# Patient Record
Sex: Female | Born: 1960 | Race: Black or African American | Hispanic: No | Marital: Single | Smoking: Current some day smoker
Health system: Southern US, Community
[De-identification: ages and names within clinical notes are randomized; demographics above are authoritative.]

## PROBLEM LIST (undated history)

## (undated) DIAGNOSIS — E785 Hyperlipidemia, unspecified: Secondary | ICD-10-CM

## (undated) DIAGNOSIS — M810 Age-related osteoporosis without current pathological fracture: Secondary | ICD-10-CM

## (undated) DIAGNOSIS — M1990 Unspecified osteoarthritis, unspecified site: Secondary | ICD-10-CM

## (undated) DIAGNOSIS — J449 Chronic obstructive pulmonary disease, unspecified: Secondary | ICD-10-CM

## (undated) DIAGNOSIS — I10 Essential (primary) hypertension: Secondary | ICD-10-CM

## (undated) DIAGNOSIS — F319 Bipolar disorder, unspecified: Secondary | ICD-10-CM

## (undated) DIAGNOSIS — I209 Angina pectoris, unspecified: Secondary | ICD-10-CM

## (undated) DIAGNOSIS — E039 Hypothyroidism, unspecified: Secondary | ICD-10-CM

## (undated) DIAGNOSIS — G5600 Carpal tunnel syndrome, unspecified upper limb: Secondary | ICD-10-CM

## (undated) HISTORY — PX: CARDIAC CATHETERIZATION: SHX172

## (undated) HISTORY — PX: OTHER SURGICAL HISTORY: SHX169

## (undated) HISTORY — PX: ABDOMINAL HYSTERECTOMY: SHX81

---

## 2008-10-24 ENCOUNTER — Emergency Department (HOSPITAL_COMMUNITY): Admission: EM | Admit: 2008-10-24 | Discharge: 2008-10-24 | Payer: Self-pay | Admitting: Emergency Medicine

## 2008-10-25 ENCOUNTER — Ambulatory Visit (HOSPITAL_COMMUNITY): Admission: RE | Admit: 2008-10-25 | Discharge: 2008-10-25 | Payer: Self-pay | Admitting: Emergency Medicine

## 2009-03-03 ENCOUNTER — Emergency Department (HOSPITAL_COMMUNITY): Admission: EM | Admit: 2009-03-03 | Discharge: 2009-03-03 | Payer: Self-pay | Admitting: Emergency Medicine

## 2009-03-04 ENCOUNTER — Emergency Department (HOSPITAL_COMMUNITY): Admission: EM | Admit: 2009-03-04 | Discharge: 2009-03-04 | Payer: Self-pay | Admitting: Emergency Medicine

## 2009-04-26 IMAGING — US US EXTREM LOW VENOUS*L*
1 series · 14 of 24 positions shown · non-contrast
Comparison: None available

CLINICAL DATA: Calf pain and swelling

LEFT LOWER EXTREMITY VENOUS DOPPLER ULTRASOUND
TECHNIQUE: Gray-scale sonography with compression, as well as color
and duplex ultrasound, were performed to evaluate the deep venous
system from the level of the common femoral vein through the
popliteal and proximal calf veins.

[Series 1: us extrem low venous*left* · 14 of 29 slices shown]
[im 1/29]
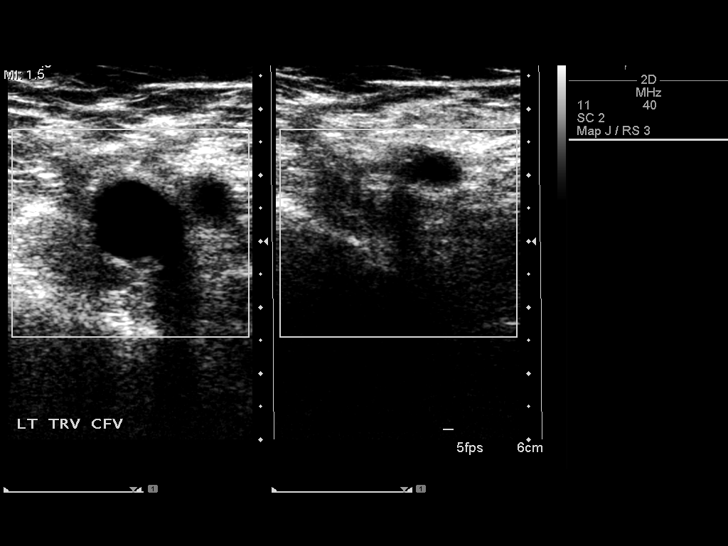
[im 3/29]
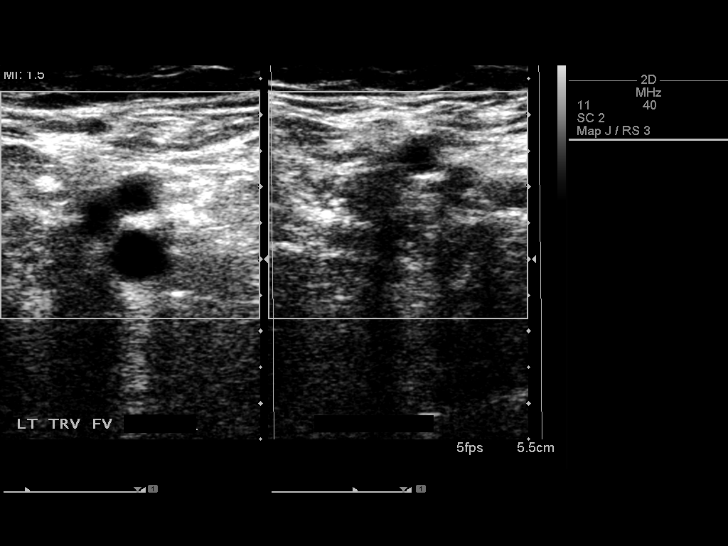
[im 5/29]
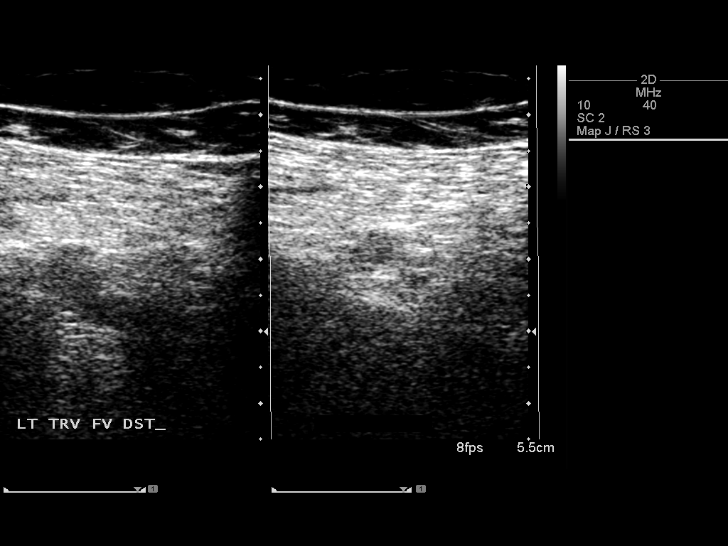
[im 8/29]
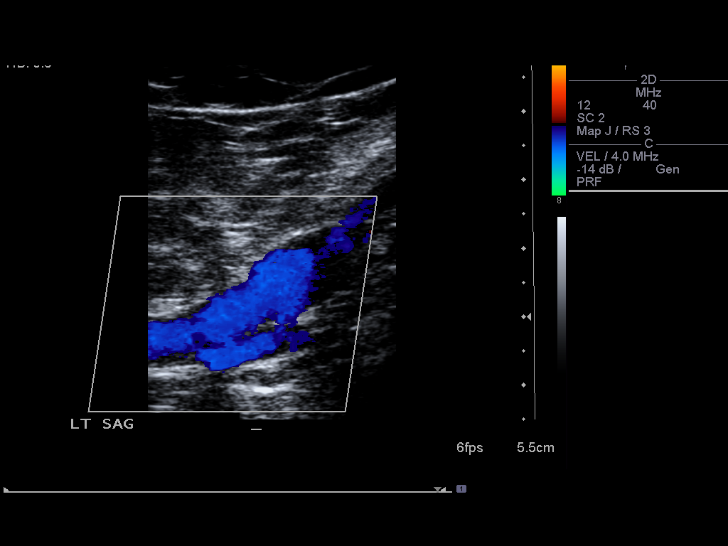
[im 9/29]
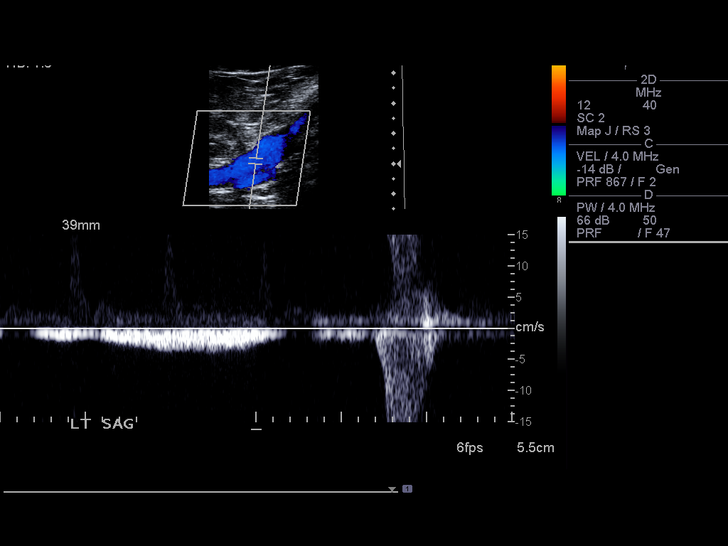
[im 11/29]
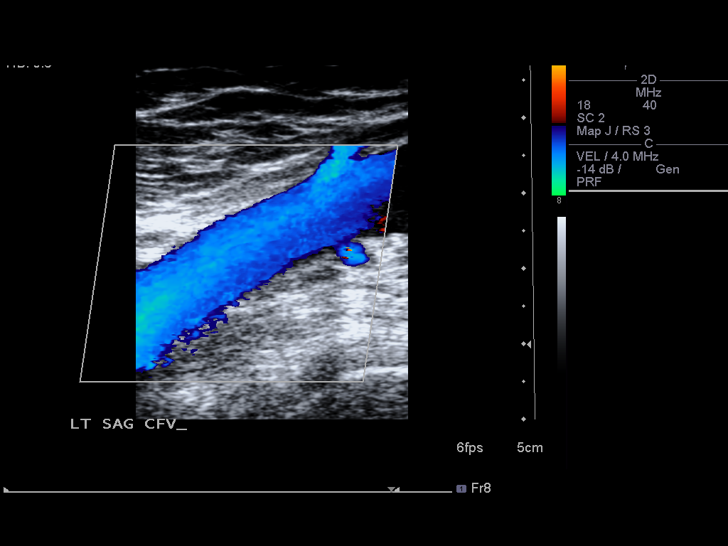
[im 14/29]
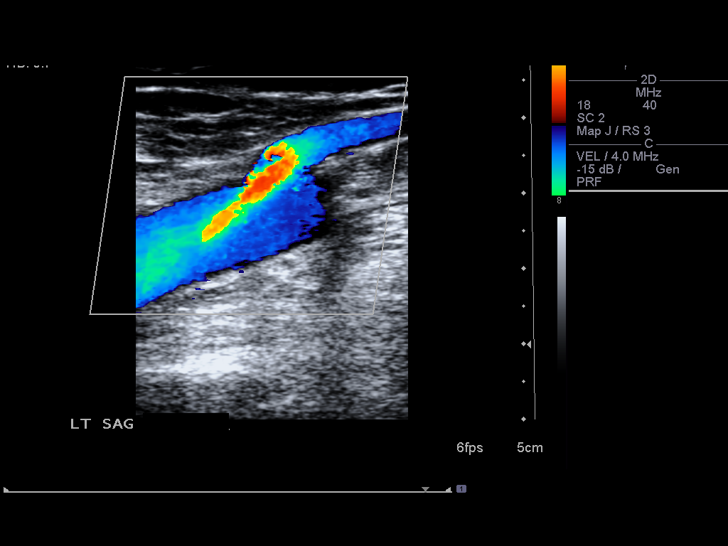
[im 15/29]
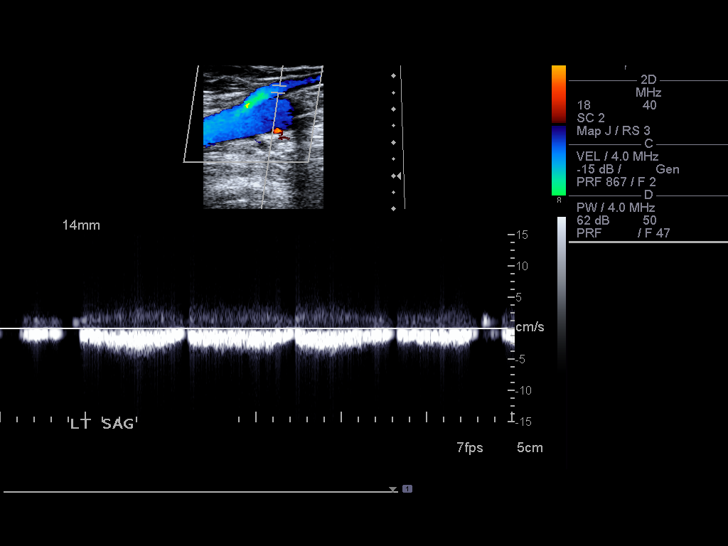
[im 18/29]
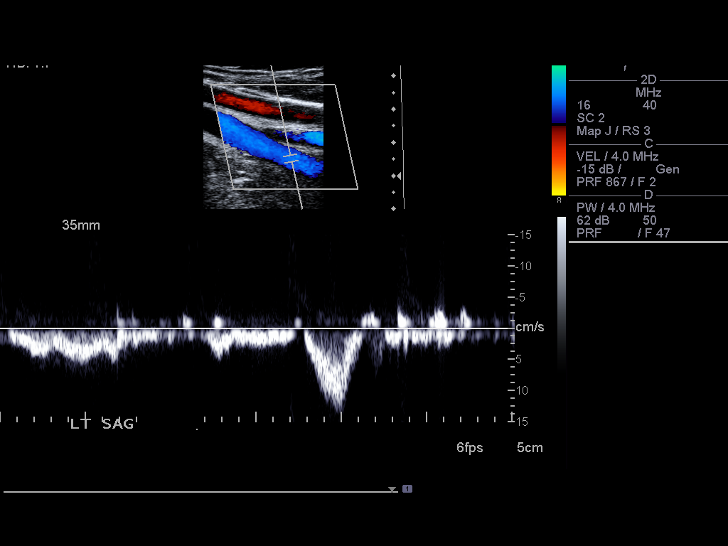
[im 20/29]
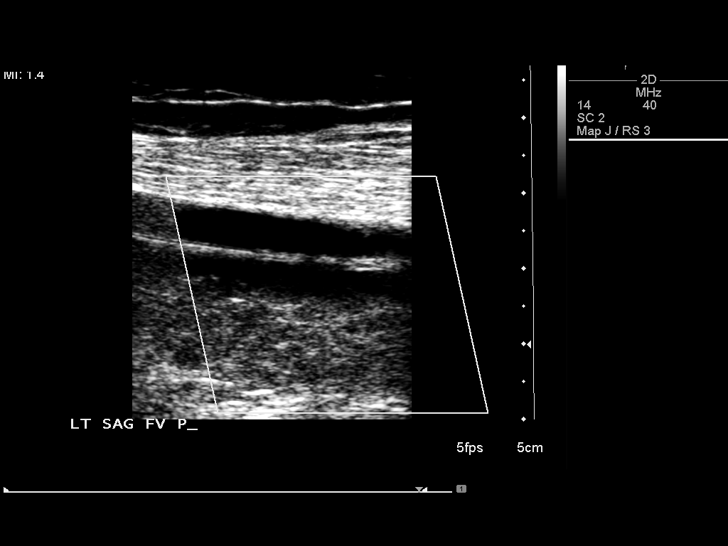
[im 22/29]
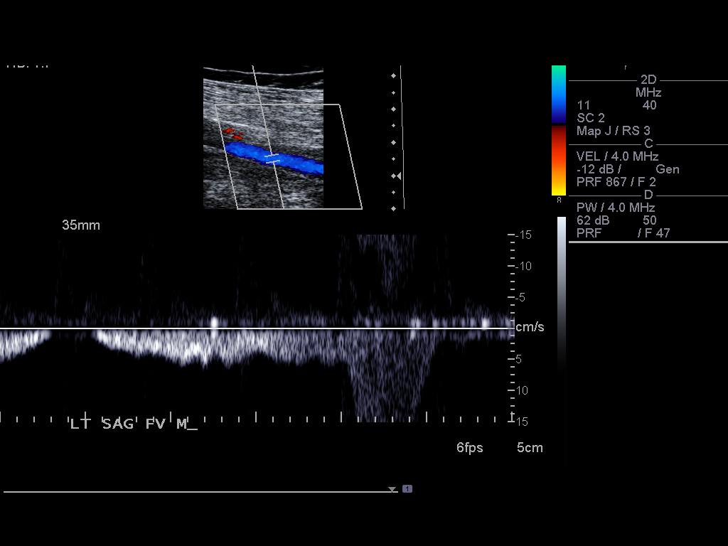
[im 24/29]
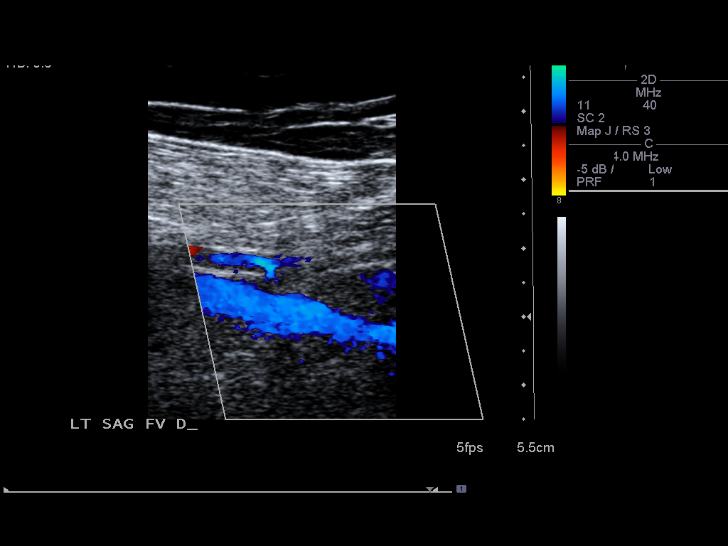
[im 26/29]
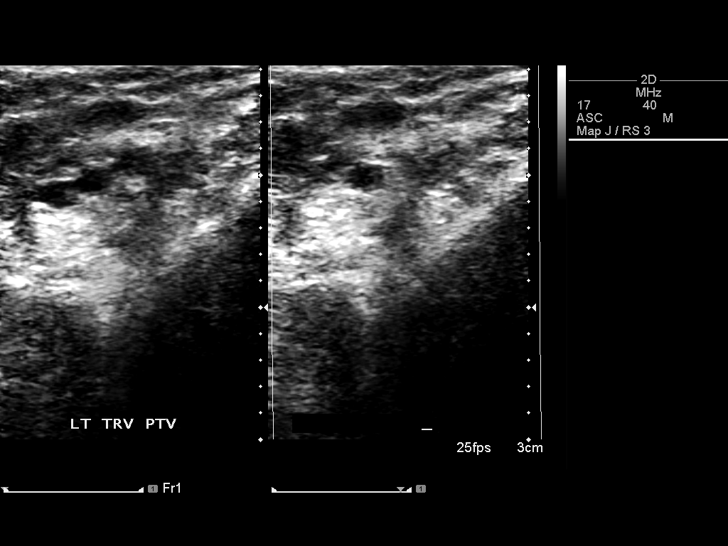
[im 29/29]
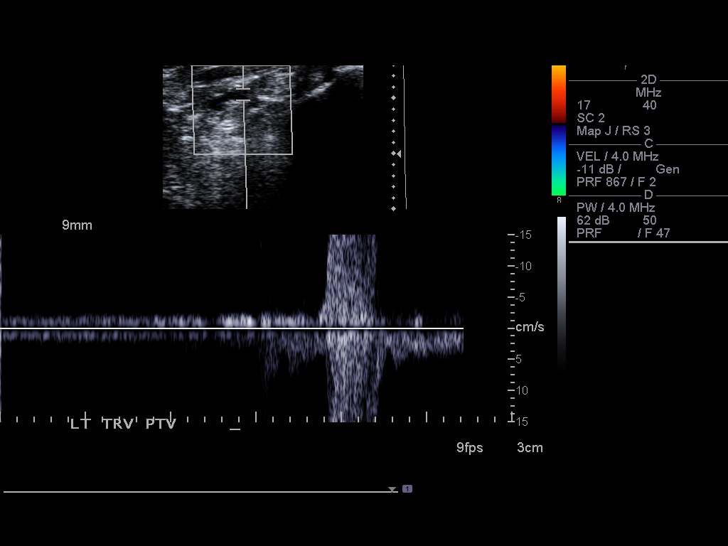

[14 of 24 positions shown; findings below may reference images not displayed]

FINDINGS: Normal compressibility of  the common femoral,
superficial femoral, and popliteal veins, as well as the proximal
calf veins.  No filling defects to suggest DVT on grayscale or
color Doppler imaging.  Doppler waveforms show normal direction of
venous flow, normal respiratory phasicity and response to
augmentation.
IMPRESSION: No evidence of  lower extremity deep vein thrombosis.

## 2012-11-18 ENCOUNTER — Encounter (HOSPITAL_COMMUNITY): Payer: Self-pay | Admitting: *Deleted

## 2012-11-18 ENCOUNTER — Emergency Department (HOSPITAL_COMMUNITY)
Admission: EM | Admit: 2012-11-18 | Discharge: 2012-11-18 | Disposition: A | Discharge status: Home / Self Care | Payer: Self-pay | Attending: Emergency Medicine | Admitting: Emergency Medicine

## 2012-11-18 DIAGNOSIS — E785 Hyperlipidemia, unspecified: Secondary | ICD-10-CM | POA: Insufficient documentation

## 2012-11-18 DIAGNOSIS — J4489 Other specified chronic obstructive pulmonary disease: Secondary | ICD-10-CM | POA: Insufficient documentation

## 2012-11-18 DIAGNOSIS — E039 Hypothyroidism, unspecified: Secondary | ICD-10-CM | POA: Insufficient documentation

## 2012-11-18 DIAGNOSIS — G8929 Other chronic pain: Secondary | ICD-10-CM

## 2012-11-18 DIAGNOSIS — J449 Chronic obstructive pulmonary disease, unspecified: Secondary | ICD-10-CM | POA: Insufficient documentation

## 2012-11-18 DIAGNOSIS — F319 Bipolar disorder, unspecified: Secondary | ICD-10-CM | POA: Insufficient documentation

## 2012-11-18 DIAGNOSIS — F411 Generalized anxiety disorder: Secondary | ICD-10-CM | POA: Insufficient documentation

## 2012-11-18 DIAGNOSIS — Z8679 Personal history of other diseases of the circulatory system: Secondary | ICD-10-CM | POA: Insufficient documentation

## 2012-11-18 DIAGNOSIS — F17200 Nicotine dependence, unspecified, uncomplicated: Secondary | ICD-10-CM | POA: Insufficient documentation

## 2012-11-18 DIAGNOSIS — M549 Dorsalgia, unspecified: Secondary | ICD-10-CM | POA: Insufficient documentation

## 2012-11-18 DIAGNOSIS — Z8739 Personal history of other diseases of the musculoskeletal system and connective tissue: Secondary | ICD-10-CM | POA: Insufficient documentation

## 2012-11-18 DIAGNOSIS — F419 Anxiety disorder, unspecified: Secondary | ICD-10-CM

## 2012-11-18 DIAGNOSIS — I10 Essential (primary) hypertension: Secondary | ICD-10-CM

## 2012-11-18 HISTORY — DX: Bipolar disorder, unspecified: F31.9

## 2012-11-18 HISTORY — DX: Chronic obstructive pulmonary disease, unspecified: J44.9

## 2012-11-18 HISTORY — DX: Hyperlipidemia, unspecified: E78.5

## 2012-11-18 HISTORY — DX: Age-related osteoporosis without current pathological fracture: M81.0

## 2012-11-18 HISTORY — DX: Unspecified osteoarthritis, unspecified site: M19.90

## 2012-11-18 HISTORY — DX: Hypothyroidism, unspecified: E03.9

## 2012-11-18 HISTORY — DX: Essential (primary) hypertension: I10

## 2012-11-18 MED ORDER — CITALOPRAM HYDROBROMIDE 10 MG PO TABS: 10.0000 mg | ORAL_TABLET | Freq: Every day | ORAL | Status: DC

## 2012-11-18 MED ORDER — HYDROCODONE-ACETAMINOPHEN 10-325 MG PO TABS: 1.0000 | ORAL_TABLET | Freq: Four times a day (QID) | ORAL | Status: DC | PRN

## 2012-11-18 MED ORDER — GABAPENTIN 100 MG PO CAPS: 100.0000 mg | ORAL_CAPSULE | Freq: Three times a day (TID) | ORAL | Status: AC

## 2012-11-18 MED ORDER — LEVOTHYROXINE SODIUM 50 MCG PO TABS: 50.0000 ug | ORAL_TABLET | Freq: Every day | ORAL | Status: AC

## 2012-11-18 MED ORDER — ALPRAZOLAM 2 MG PO TABS: 2.0000 mg | ORAL_TABLET | Freq: Four times a day (QID) | ORAL | Status: AC | PRN

## 2012-11-18 MED ORDER — ZOLPIDEM TARTRATE 10 MG PO TABS: 10.0000 mg | ORAL_TABLET | Freq: Every evening | ORAL | Status: AC | PRN

## 2012-11-18 MED ORDER — AMLODIPINE BESYLATE 10 MG PO TABS: 10.0000 mg | ORAL_TABLET | Freq: Every day | ORAL | Status: DC

## 2012-11-18 NOTE — ED Provider Notes (Signed)
History    This chart was scribed for Yesenia Booze, MD, MD by Smitty Pluck, ED Scribe. The patient was seen in room APA11 and the patient's care was started at 2:59PM.   CSN: [id number]  Arrival date & time 11/18/12  1405      Chief Complaint  Patient presents with  . Back Pain    (Consider location/radiation/quality/duration/timing/severity/associated sxs/prior treatment) The history is provided by the patient. No language interpreter was used.   Yesenia Hughes is a 51 y.o. female with h/o osteoporosis, COPD, HTN, bipolar disorder and hypothyroid who presents to the Emergency Department complaining of constant, moderate back pain and knee pain. Pt reports that she does not have any medication because she is from [facility]. She normally takes Norco and flexeril for pain. Pt reports that she smokes cigarettes. She denies any other pain.   Past Medical History  Diagnosis Date  . COPD (chronic obstructive pulmonary disease)   . Bipolar 1 disorder   . Osteoporosis   . Hypothyroid   . Hyperlipemia   . Hypertension   . Arthritis     Past Surgical History  Procedure Date  . Recovering addict   . Cardiac catheterization   . Abdominal hysterectomy     History reviewed. No pertinent family history.  History  Substance Use Topics  . Smoking status: Current Some Day Smoker  . Smokeless tobacco: Not on file  . Alcohol Use: No    OB History    Grav Para Term Preterm Abortions TAB SAB Ect Mult Living                  Review of Systems  All other systems reviewed and are negative.   10 Systems reviewed and all are negative for acute change except as noted in the HPI.   Allergies  Review of patient's allergies indicates no known allergies.  Home Medications  No current outpatient prescriptions on file.  BP 134/106  Pulse 92  Temp 98.3 F (36.8 C) (Oral)  Resp 18  Ht 5\' 4"  (1.626 m)  Wt 172 lb (78.019 kg)  BMI 29.52 kg/m2  SpO2 100%  Physical Exam    Nursing note and vitals reviewed. Constitutional: She is oriented to person, place, and time. She appears well-developed and well-nourished. No distress.  HENT:  Head: Normocephalic and atraumatic.  Eyes: Conjunctivae normal are normal.  Neck: Normal range of motion. Neck supple.  Cardiovascular: Normal rate, regular rhythm and normal heart sounds.   Pulmonary/Chest: Effort normal. No respiratory distress. She has no wheezes. She has no rales.  Neurological: She is alert and oriented to person, place, and time.  Skin: Skin is warm and dry.  Psychiatric: She has a normal mood and affect. Her behavior is normal.    ED Course  Procedures (including critical care time) DIAGNOSTIC STUDIES: Oxygen Saturation is 100% on room air, normal by my interpretation.    COORDINATION OF CARE: 3:01 PM Discussed ED treatment with pt     1. Exacerbation of chronic back pain   2. Hypertension   3. Anxiety       MDM  Exacerbation of chronic back pain 2 to lack of medication. Hypertension exacerbated by lack of medication. I have gone through her medication list and given her prescriptions for 1 week supply of all medications which should be taken on a daily basis and that delay of several days would be harmful.    I personally performed the services described in this  documentation, which was scribed in my presence. The recorded information has been reviewed and is accurate.      Yesenia Booze, MD 11/18/12 2112
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
## 2013-02-25 ENCOUNTER — Encounter (HOSPITAL_COMMUNITY): Payer: Self-pay | Admitting: *Deleted

## 2013-02-25 ENCOUNTER — Emergency Department (HOSPITAL_COMMUNITY)
Admission: EM | Admit: 2013-02-25 | Discharge: 2013-02-25 | Disposition: A | Discharge status: Home / Self Care | Payer: Medicaid - Out of State | Attending: Emergency Medicine | Admitting: Emergency Medicine

## 2013-02-25 DIAGNOSIS — E785 Hyperlipidemia, unspecified: Secondary | ICD-10-CM | POA: Insufficient documentation

## 2013-02-25 DIAGNOSIS — M79609 Pain in unspecified limb: Secondary | ICD-10-CM | POA: Insufficient documentation

## 2013-02-25 DIAGNOSIS — G479 Sleep disorder, unspecified: Secondary | ICD-10-CM | POA: Insufficient documentation

## 2013-02-25 DIAGNOSIS — G8929 Other chronic pain: Secondary | ICD-10-CM | POA: Insufficient documentation

## 2013-02-25 DIAGNOSIS — E039 Hypothyroidism, unspecified: Secondary | ICD-10-CM | POA: Insufficient documentation

## 2013-02-25 DIAGNOSIS — Z79899 Other long term (current) drug therapy: Secondary | ICD-10-CM | POA: Insufficient documentation

## 2013-02-25 DIAGNOSIS — J449 Chronic obstructive pulmonary disease, unspecified: Secondary | ICD-10-CM | POA: Insufficient documentation

## 2013-02-25 DIAGNOSIS — M810 Age-related osteoporosis without current pathological fracture: Secondary | ICD-10-CM | POA: Insufficient documentation

## 2013-02-25 DIAGNOSIS — IMO0002 Reserved for concepts with insufficient information to code with codable children: Secondary | ICD-10-CM | POA: Insufficient documentation

## 2013-02-25 DIAGNOSIS — J4489 Other specified chronic obstructive pulmonary disease: Secondary | ICD-10-CM | POA: Insufficient documentation

## 2013-02-25 DIAGNOSIS — M545 Low back pain, unspecified: Secondary | ICD-10-CM | POA: Insufficient documentation

## 2013-02-25 DIAGNOSIS — Z760 Encounter for issue of repeat prescription: Secondary | ICD-10-CM

## 2013-02-25 DIAGNOSIS — M129 Arthropathy, unspecified: Secondary | ICD-10-CM | POA: Insufficient documentation

## 2013-02-25 DIAGNOSIS — F17200 Nicotine dependence, unspecified, uncomplicated: Secondary | ICD-10-CM | POA: Insufficient documentation

## 2013-02-25 DIAGNOSIS — I10 Essential (primary) hypertension: Secondary | ICD-10-CM | POA: Insufficient documentation

## 2013-02-25 DIAGNOSIS — Z8679 Personal history of other diseases of the circulatory system: Secondary | ICD-10-CM | POA: Insufficient documentation

## 2013-02-25 DIAGNOSIS — F319 Bipolar disorder, unspecified: Secondary | ICD-10-CM | POA: Insufficient documentation

## 2013-02-25 HISTORY — DX: Angina pectoris, unspecified: I20.9

## 2013-02-25 MED ORDER — LITHIUM CARBONATE ER 450 MG PO TBCR: 450.0000 mg | EXTENDED_RELEASE_TABLET | Freq: Two times a day (BID) | ORAL | Status: DC

## 2013-02-25 MED ORDER — HYDROCODONE-ACETAMINOPHEN 10-325 MG PO TABS: 1.0000 | ORAL_TABLET | Freq: Four times a day (QID) | ORAL | Status: DC | PRN

## 2013-02-25 MED ORDER — ALPRAZOLAM 2 MG PO TABS: 2.0000 mg | ORAL_TABLET | Freq: Three times a day (TID) | ORAL | Status: DC | PRN

## 2013-02-25 MED ORDER — HYDROCODONE-ACETAMINOPHEN 5-325 MG PO TABS
2.0000 | ORAL_TABLET | Freq: Once | ORAL | Status: AC
  Administered 2013-02-25: 2 via ORAL
  Filled 2013-02-25: qty 2
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
## 2013-02-27 NOTE — ED Provider Notes (Signed)
Medical screening examination/treatment/procedure(s) were performed by non-physician practitioner and as supervising physician I was immediately available for consultation/collaboration.  Kelon Easom, MD 02/27/13 2351 

## 2013-10-18 ENCOUNTER — Emergency Department (HOSPITAL_COMMUNITY)
Admission: EM | Admit: 2013-10-18 | Discharge: 2013-10-18 | Disposition: A | Discharge status: Home / Self Care | Payer: Medicaid - Out of State | Attending: Emergency Medicine | Admitting: Emergency Medicine

## 2013-10-18 ENCOUNTER — Encounter (HOSPITAL_COMMUNITY): Payer: Self-pay | Admitting: Emergency Medicine

## 2013-10-18 DIAGNOSIS — M810 Age-related osteoporosis without current pathological fracture: Secondary | ICD-10-CM | POA: Insufficient documentation

## 2013-10-18 DIAGNOSIS — M545 Low back pain, unspecified: Secondary | ICD-10-CM

## 2013-10-18 DIAGNOSIS — R52 Pain, unspecified: Secondary | ICD-10-CM | POA: Insufficient documentation

## 2013-10-18 DIAGNOSIS — Z79899 Other long term (current) drug therapy: Secondary | ICD-10-CM | POA: Insufficient documentation

## 2013-10-18 DIAGNOSIS — E039 Hypothyroidism, unspecified: Secondary | ICD-10-CM | POA: Insufficient documentation

## 2013-10-18 DIAGNOSIS — Z8739 Personal history of other diseases of the musculoskeletal system and connective tissue: Secondary | ICD-10-CM | POA: Insufficient documentation

## 2013-10-18 DIAGNOSIS — E785 Hyperlipidemia, unspecified: Secondary | ICD-10-CM | POA: Insufficient documentation

## 2013-10-18 DIAGNOSIS — G8929 Other chronic pain: Secondary | ICD-10-CM | POA: Insufficient documentation

## 2013-10-18 DIAGNOSIS — I209 Angina pectoris, unspecified: Secondary | ICD-10-CM | POA: Insufficient documentation

## 2013-10-18 DIAGNOSIS — F319 Bipolar disorder, unspecified: Secondary | ICD-10-CM | POA: Insufficient documentation

## 2013-10-18 DIAGNOSIS — J449 Chronic obstructive pulmonary disease, unspecified: Secondary | ICD-10-CM | POA: Insufficient documentation

## 2013-10-18 DIAGNOSIS — F17200 Nicotine dependence, unspecified, uncomplicated: Secondary | ICD-10-CM | POA: Insufficient documentation

## 2013-10-18 DIAGNOSIS — I10 Essential (primary) hypertension: Secondary | ICD-10-CM | POA: Insufficient documentation

## 2013-10-18 DIAGNOSIS — J4489 Other specified chronic obstructive pulmonary disease: Secondary | ICD-10-CM | POA: Insufficient documentation

## 2013-10-18 HISTORY — DX: Carpal tunnel syndrome, unspecified upper limb: G56.00

## 2013-10-18 MED ORDER — HYDROCODONE-ACETAMINOPHEN 5-325 MG PO TABS: 1.0000 | ORAL_TABLET | ORAL | Status: DC | PRN

## 2013-10-18 NOTE — ED Notes (Addendum)
Low back  Pain with pain both legs.  For 1 week,  Wearing a splint on rt wrist for carpal tunnel

## 2013-10-19 NOTE — ED Provider Notes (Signed)
CSN: [id number]     Arrival date & time 10/18/13  2022 History   First MD Initiated Contact with Patient 10/18/13 2042     Chief Complaint  Patient presents with  . Back Pain   (Consider location/radiation/quality/duration/timing/severity/associated sxs/prior Treatment) HPI Comments: Yesenia Hughes is a 52 y.o. Female presenting with  acute on chronic low back pain which has is present every day an unchanged.  She has been visiting family here from [place] for the past month and has run out of her hydrocodone 2 days ago.  She has mental health issues and came here for family supportn,  But plans to see her doctor in [place] in 6 days.   Patient denies any new injury specifically.  There is no radiation of pain into her legs and she weakness or numbness in the lower extremities with no urinary or bowel retention or incontinence.  Patient does not have a history of cancer or IVDU.      The history is provided by the patient.    Past Medical History  Diagnosis Date  . COPD (chronic obstructive pulmonary disease)   . Bipolar 1 disorder   . Osteoporosis   . Hypothyroid   . Hyperlipemia   . Hypertension   . Arthritis   . Anginal pain   . Carpal tunnel syndrome    Past Surgical History  Procedure Laterality Date  . Recovering addict    . Cardiac catheterization    . Abdominal hysterectomy     History reviewed. No pertinent family history. History  Substance Use Topics  . Smoking status: Current Some Day Smoker  . Smokeless tobacco: Not on file  . Alcohol Use: No   OB History   Grav Para Term Preterm Abortions TAB SAB Ect Mult Living                 Review of Systems  Constitutional: Negative for fever.  Respiratory: Negative for shortness of breath.   Cardiovascular: Negative for chest pain and leg swelling.  Gastrointestinal: Negative for abdominal pain, constipation and abdominal distention.  Genitourinary: Negative for dysuria, urgency, frequency, flank pain and  difficulty urinating.  Musculoskeletal: Positive for back pain. Negative for gait problem and joint swelling.  Skin: Negative for rash.  Neurological: Negative for weakness and numbness.    Allergies  Review of patient's allergies indicates no known allergies.  Home Medications   Current Outpatient Rx  Name  Route  Sig  Dispense  Refill  . albuterol (PROAIR HFA) 108 (90 BASE) MCG/ACT inhaler   Inhalation   Inhale 2 puffs into the lungs every 6 (six) hours as needed for wheezing or shortness of breath.          Marland Kitchen alendronate (FOSAMAX) 70 MG tablet   Oral   Take 70 mg by mouth every 7 (seven) days. Take with a full glass of water on an empty stomach OSTEOPOROSIS         . alprazolam (XANAX) 2 MG tablet   Oral   Take 1 tablet (2 mg total) by mouth 4 (four) times daily as needed. For ANXIETY   20 tablet   0   . amLODipine (NORVASC) 10 MG tablet   Oral   Take 1 tablet (10 mg total) by mouth daily. BLOOD PRESSURE   7 tablet   0   . cetirizine (ZYRTEC) 10 MG tablet   Oral   Take 10 mg by mouth daily. FOR ALLERGIES         .  cyclobenzaprine (FLEXERIL) 5 MG tablet   Oral   Take 5 mg by mouth 3 (three) times daily as needed. For MUSCLE SPASMS         . dexlansoprazole (DEXILANT) 60 MG capsule   Oral   Take 60 mg by mouth daily. ACID REFLUX/GERD         . escitalopram (LEXAPRO) 20 MG tablet   Oral   Take 20 mg by mouth daily.         . Fluticasone-Salmeterol (ADVAIR) 500-50 MCG/DOSE AEPB   Inhalation   Inhale 1 puff into the lungs every 12 (twelve) hours.         . gabapentin (NEURONTIN) 100 MG capsule   Oral   Take 1 capsule (100 mg total) by mouth 3 (three) times daily. For NEUROPATHY   21 capsule   0   . imipramine (TOFRANIL) 50 MG tablet   Oral   Take 150 mg by mouth at bedtime. DEPRESSION         . levothyroxine (SYNTHROID, LEVOTHROID) 50 MCG tablet   Oral   Take 1 tablet (50 mcg total) by mouth daily. For THYROID   7 tablet   0   .  lithium carbonate (ESKALITH) 450 MG CR tablet   Oral   Take 450 mg by mouth at bedtime.          . Potassium Chloride CR (MICRO-K) 8 MEQ CPCR   Oral   Take 8 mEq by mouth daily.         . pravastatin (PRAVACHOL) 40 MG tablet   Oral   Take 40 mg by mouth daily. For CHOLESTEROL         . Vitamin D, Ergocalciferol, (DRISDOL) 50000 UNITS CAPS   Oral   Take 50,000 Units by mouth every 7 (seven) days. SUPPLEMENT(Thursday)         . zolpidem (AMBIEN) 10 MG tablet   Oral   Take 1 tablet (10 mg total) by mouth at bedtime as needed. For SLEEP   5 tablet   0   . HYDROcodone-acetaminophen (NORCO/VICODIN) 5-325 MG per tablet   Oral   Take 1 tablet by mouth every 4 (four) hours as needed.   20 tablet   0    BP 138/103  Pulse 87  Temp(Src) 97.6 F (36.4 C) (Oral)  Resp 20  Ht 5\' 4"  (1.626 m)  Wt 167 lb (75.751 kg)  BMI 28.65 kg/m2  SpO2 100% Physical Exam  Nursing note and vitals reviewed. Constitutional: She appears well-developed and well-nourished.  HENT:  Head: Normocephalic.  Eyes: Conjunctivae are normal.  Neck: Normal range of motion. Neck supple.  Cardiovascular: Normal rate and intact distal pulses.   Pedal pulses normal.  Pulmonary/Chest: Effort normal.  Abdominal: Soft. Bowel sounds are normal. She exhibits no distension and no mass.  Musculoskeletal: Normal range of motion. She exhibits tenderness. She exhibits no edema.       Lumbar back: She exhibits tenderness. She exhibits no swelling, no edema and no spasm.  Bilateral paralumber ttp.  No SI joint tenderness.  Neurological: She is alert. She has normal strength. She displays no atrophy and no tremor. No sensory deficit. Gait normal.  Reflex Scores:      Patellar reflexes are 2+ on the right side and 2+ on the left side.      Achilles reflexes are 2+ on the right side and 2+ on the left side. No strength deficit noted in hip and knee flexor and extensor  muscle groups.  Ankle flexion and extension  intact.  Skin: Skin is warm and dry.  Psychiatric: She has a normal mood and affect.    ED Course  Procedures (including critical care time) Labs Review Labs Reviewed - No data to display Imaging Review No results found.  EKG Interpretation   None       MDM   1. Chronic low back pain    [place] controlled substance database reviewed. No neuro deficit on exam or by history to suggest emergent or surgical presentation.  Also discussed worsened sx that should prompt immediate re-evaluation including distal weakness, bowel/bladder retention/incontinence.  She was prescribed a small quantity of hydrcodone, encouraged f/u with pcp next week as planned.          Burgess Amor, PA-C 10/20/13 2019

## 2013-10-23 NOTE — ED Provider Notes (Signed)
Medical screening examination/treatment/procedure(s) were performed by non-physician practitioner and as supervising physician I was immediately available for consultation/collaboration.  EKG Interpretation   None        Okechukwu Regnier, MD 10/23/13 0745 

## 2014-10-14 ENCOUNTER — Emergency Department (HOSPITAL_COMMUNITY)
Admission: EM | Admit: 2014-10-14 | Discharge: 2014-10-15 | Disposition: A | Discharge status: Home / Self Care | Payer: Medicaid - Out of State | Attending: Emergency Medicine | Admitting: Emergency Medicine

## 2014-10-14 ENCOUNTER — Encounter (HOSPITAL_COMMUNITY): Payer: Self-pay | Admitting: Emergency Medicine

## 2014-10-14 DIAGNOSIS — Z9889 Other specified postprocedural states: Secondary | ICD-10-CM | POA: Diagnosis not present

## 2014-10-14 DIAGNOSIS — M1990 Unspecified osteoarthritis, unspecified site: Secondary | ICD-10-CM | POA: Insufficient documentation

## 2014-10-14 DIAGNOSIS — Y9389 Activity, other specified: Secondary | ICD-10-CM | POA: Insufficient documentation

## 2014-10-14 DIAGNOSIS — J449 Chronic obstructive pulmonary disease, unspecified: Secondary | ICD-10-CM | POA: Insufficient documentation

## 2014-10-14 DIAGNOSIS — F419 Anxiety disorder, unspecified: Secondary | ICD-10-CM | POA: Insufficient documentation

## 2014-10-14 DIAGNOSIS — Z79899 Other long term (current) drug therapy: Secondary | ICD-10-CM | POA: Insufficient documentation

## 2014-10-14 DIAGNOSIS — F1110 Opioid abuse, uncomplicated: Secondary | ICD-10-CM | POA: Diagnosis not present

## 2014-10-14 DIAGNOSIS — E785 Hyperlipidemia, unspecified: Secondary | ICD-10-CM | POA: Diagnosis not present

## 2014-10-14 DIAGNOSIS — F1210 Cannabis abuse, uncomplicated: Secondary | ICD-10-CM | POA: Insufficient documentation

## 2014-10-14 DIAGNOSIS — Z720 Tobacco use: Secondary | ICD-10-CM | POA: Diagnosis not present

## 2014-10-14 DIAGNOSIS — Y9289 Other specified places as the place of occurrence of the external cause: Secondary | ICD-10-CM | POA: Insufficient documentation

## 2014-10-14 DIAGNOSIS — I10 Essential (primary) hypertension: Secondary | ICD-10-CM | POA: Insufficient documentation

## 2014-10-14 DIAGNOSIS — E039 Hypothyroidism, unspecified: Secondary | ICD-10-CM | POA: Insufficient documentation

## 2014-10-14 DIAGNOSIS — Z7951 Long term (current) use of inhaled steroids: Secondary | ICD-10-CM | POA: Diagnosis not present

## 2014-10-14 DIAGNOSIS — F319 Bipolar disorder, unspecified: Secondary | ICD-10-CM | POA: Insufficient documentation

## 2014-10-14 DIAGNOSIS — T391X1A Poisoning by 4-Aminophenol derivatives, accidental (unintentional), initial encounter: Secondary | ICD-10-CM | POA: Insufficient documentation

## 2014-10-14 DIAGNOSIS — T3991XA Poisoning by unspecified nonopioid analgesic, antipyretic and antirheumatic, accidental (unintentional), initial encounter: Secondary | ICD-10-CM | POA: Diagnosis not present

## 2014-10-14 DIAGNOSIS — Z8669 Personal history of other diseases of the nervous system and sense organs: Secondary | ICD-10-CM | POA: Insufficient documentation

## 2014-10-14 DIAGNOSIS — E876 Hypokalemia: Secondary | ICD-10-CM | POA: Insufficient documentation

## 2014-10-14 DIAGNOSIS — T50901A Poisoning by unspecified drugs, medicaments and biological substances, accidental (unintentional), initial encounter: Secondary | ICD-10-CM

## 2014-10-14 LAB — COMPREHENSIVE METABOLIC PANEL
ALBUMIN: 4 g/dL (ref 3.5–5.2)
ALT: 16 U/L (ref 0–35)
ANION GAP: 18 — AB (ref 5–15)
AST: 20 U/L (ref 0–37)
Alkaline Phosphatase: 58 U/L (ref 39–117)
BUN: 15 mg/dL (ref 6–23)
CHLORIDE: 100 meq/L (ref 96–112)
CO2: 20 mEq/L (ref 19–32)
CREATININE: 0.76 mg/dL (ref 0.50–1.10)
Calcium: 9.5 mg/dL (ref 8.4–10.5)
Glucose, Bld: 112 mg/dL — ABNORMAL HIGH (ref 70–99)
POTASSIUM: 2.6 meq/L — AB (ref 3.7–5.3)
SODIUM: 138 meq/L (ref 137–147)
TOTAL PROTEIN: 8 g/dL (ref 6.0–8.3)
Total Bilirubin: 0.3 mg/dL (ref 0.3–1.2)

## 2014-10-14 LAB — CBC
HCT: 36.3 % (ref 36.0–46.0)
Hemoglobin: 11.9 g/dL — ABNORMAL LOW (ref 12.0–15.0)
MCH: 28.7 pg (ref 26.0–34.0)
MCHC: 32.8 g/dL (ref 30.0–36.0)
MCV: 87.5 fL (ref 78.0–100.0)
PLATELETS: 348 10*3/uL (ref 150–400)
RBC: 4.15 MIL/uL (ref 3.87–5.11)
RDW: 13.8 % (ref 11.5–15.5)
WBC: 5.8 10*3/uL (ref 4.0–10.5)

## 2014-10-14 LAB — SALICYLATE LEVEL: SALICYLATE LVL: 26.8 mg/dL — AB (ref 2.8–20.0)

## 2014-10-14 MED ORDER — POTASSIUM CHLORIDE CRYS ER 20 MEQ PO TBCR
40.0000 meq | EXTENDED_RELEASE_TABLET | Freq: Once | ORAL | Status: AC
  Administered 2014-10-15: 40 meq via ORAL
  Filled 2014-10-14: qty 2

## 2014-10-14 MED ORDER — POTASSIUM CHLORIDE 10 MEQ/100ML IV SOLN
10.0000 meq | Freq: Once | INTRAVENOUS | Status: AC
  Administered 2014-10-15: 10 meq via INTRAVENOUS
  Filled 2014-10-14: qty 100

## 2014-10-14 NOTE — ED Notes (Signed)
CRITICAL VALUE ALERT  Critical value received:  Potassium 2.6  Date of notification:  10/14/2014  Time of notification:  2346  Critical value read back:Yes.    Nurse who received alert:  Rudene AndaSusan Frye-Yarbrough, RN  MD notified (1st page):  Kerrie BuffaloHope Neese, NP  Time MD responded:  [phone]

## 2014-10-14 NOTE — ED Notes (Signed)
Patient ran out of hydrocodone pain medication. Patient's friend reports patient took 6 Extra strength headache relief tablets (250 mg acetaminophen, 250 mg ASA, and 65 mg caffeine)  at 1820 and 10 tablets at approximately 2100. Patient denies suicidal ideation or intentions of harming herself.

## 2014-10-14 NOTE — ED Provider Notes (Addendum)
CSN: [id number]     Arrival date & time 10/14/14  2150 History  This chart was scribed for Yesenia SkeensJoshua M Chanda Laperle, MD by Richarda Overlieichard Holland, ED Scribe. This patient was seen in room APA16A/APA16A and the patient's care was started 10:50 PM.    Chief Complaint  Patient presents with  . Drug Overdose    The history is provided by the patient. No language interpreter was used.   HPI Comments: Yesenia KaufmannMaxie L Hughes is a 53 y.o. female with a history of arthritis and COPD who presents to the Emergency Department complaining of generalized body aches. Relative reports that she took 6 extra strength headache relief tablets 250mg  at 18:20 then 10 tylenol 250mg  at 21:00 because she was in pain and ran out of her hydrocodone pain medication. Patient denies suicidal ideation or intentions of harming herself. She reports no other symptoms or modifying factors at this time.   Past Medical History  Diagnosis Date  . COPD (chronic obstructive pulmonary disease)   . Bipolar 1 disorder   . Osteoporosis   . Hypothyroid   . Hyperlipemia   . Hypertension   . Arthritis   . Anginal pain   . Carpal tunnel syndrome    Past Surgical History  Procedure Laterality Date  . Recovering addict    . Cardiac catheterization    . Abdominal hysterectomy     History reviewed. No pertinent family history. History  Substance Use Topics  . Smoking status: Current Some Day Smoker  . Smokeless tobacco: Not on file  . Alcohol Use: No   OB History   Grav Para Term Preterm Abortions TAB SAB Ect Mult Living                 Review of Systems  Musculoskeletal: Positive for arthralgias.  Psychiatric/Behavioral: Negative for suicidal ideas and self-injury.  All other systems reviewed and are negative.     Allergies  Review of patient's allergies indicates no known allergies.  Home Medications   Prior to Admission medications   Medication Sig Start Date End Date Taking? Authorizing Provider  albuterol (PROAIR HFA) 108 (90  BASE) MCG/ACT inhaler Inhale 2 puffs into the lungs every 6 (six) hours as needed for wheezing or shortness of breath.     Historical Provider, MD  alendronate (FOSAMAX) 70 MG tablet Take 70 mg by mouth every 7 (seven) days. Take with a full glass of water on an empty stomach OSTEOPOROSIS    Historical Provider, MD  alprazolam Prudy Feeler(XANAX) 2 MG tablet Take 1 tablet (2 mg total) by mouth 4 (four) times daily as needed. For ANXIETY 11/18/12   Dione Boozeavid Glick, MD  amLODipine (NORVASC) 10 MG tablet Take 1 tablet (10 mg total) by mouth daily. BLOOD PRESSURE 11/18/12   Dione Boozeavid Glick, MD  cetirizine (ZYRTEC) 10 MG tablet Take 10 mg by mouth daily. FOR ALLERGIES    Historical Provider, MD  cyclobenzaprine (FLEXERIL) 5 MG tablet Take 5 mg by mouth 3 (three) times daily as needed. For MUSCLE SPASMS    Historical Provider, MD  dexlansoprazole (DEXILANT) 60 MG capsule Take 60 mg by mouth daily. ACID REFLUX/GERD    Historical Provider, MD  escitalopram (LEXAPRO) 20 MG tablet Take 20 mg by mouth daily.    Historical Provider, MD  Fluticasone-Salmeterol (ADVAIR) 500-50 MCG/DOSE AEPB Inhale 1 puff into the lungs every 12 (twelve) hours.    Historical Provider, MD  gabapentin (NEURONTIN) 100 MG capsule Take 1 capsule (100 mg total) by mouth 3 (three) times  daily. For NEUROPATHY 11/18/12   Dione Boozeavid Glick, MD  HYDROcodone-acetaminophen (NORCO/VICODIN) 5-325 MG per tablet Take 1 tablet by mouth every 4 (four) hours as needed. 10/18/13   Burgess AmorJulie Idol, PA-C  imipramine (TOFRANIL) 50 MG tablet Take 150 mg by mouth at bedtime. DEPRESSION    Historical Provider, MD  levothyroxine (SYNTHROID, LEVOTHROID) 50 MCG tablet Take 1 tablet (50 mcg total) by mouth daily. For THYROID 11/18/12   Dione Boozeavid Glick, MD  lithium carbonate (ESKALITH) 450 MG CR tablet Take 450 mg by mouth at bedtime.     Historical Provider, MD  Potassium Chloride CR (MICRO-K) 8 MEQ CPCR Take 8 mEq by mouth daily.    Historical Provider, MD  pravastatin (PRAVACHOL) 40 MG tablet  Take 40 mg by mouth daily. For CHOLESTEROL    Historical Provider, MD  Vitamin D, Ergocalciferol, (DRISDOL) 50000 UNITS CAPS Take 50,000 Units by mouth every 7 (seven) days. SUPPLEMENT(Thursday)    Historical Provider, MD  zolpidem (AMBIEN) 10 MG tablet Take 1 tablet (10 mg total) by mouth at bedtime as needed. For SLEEP 11/18/12   Dione Boozeavid Glick, MD   BP 154/86  Pulse 74  Temp(Src) 98.5 F (36.9 C) (Oral)  Resp 16  Ht 5\' 4"  (1.626 m)  Wt 176 lb (79.833 kg)  BMI 30.20 kg/m2  SpO2 100% Physical Exam  Nursing note and vitals reviewed. Constitutional: She is oriented to person, place, and time. She appears well-developed and well-nourished.  HENT:  Head: Normocephalic and atraumatic.  Eyes: Pupils are equal, round, and reactive to light.  Cardiovascular: Normal rate.   Pulmonary/Chest: Effort normal.  Abdominal: Soft. There is no tenderness.  Musculoskeletal: Normal range of motion.  No significant swelling to knees or ankles. No leg swelling   Neurological: She is alert and oriented to person, place, and time.  Skin: Skin is warm and dry.   No sign of cellulitis.  Psychiatric: Her mood appears anxious. Her affect is not blunt. Her speech is not rapid and/or pressured. Thought content is not delusional. She does not exhibit a depressed mood. She expresses no suicidal plans.  Patient tearful, frustrated with pain control outpatient. Patient denies suicidal ideation or plan.    ED Course  Procedures  DIAGNOSTIC STUDIES: Oxygen Saturation is 100% on RA, normal by my interpretation.    COORDINATION OF CARE: 10:57 PM Discussed treatment plan with pt at bedside and pt agreed to plan.   Labs Review Labs Reviewed  CBC - Abnormal; Notable for the following:    Hemoglobin 11.9 (*)    All other components within normal limits  COMPREHENSIVE METABOLIC PANEL - Abnormal; Notable for the following:    Potassium 2.6 (*)    Glucose, Bld 112 (*)    Anion gap 18 (*)    All other components  within normal limits  SALICYLATE LEVEL - Abnormal; Notable for the following:    Salicylate Lvl 26.8 (*)    All other components within normal limits  URINE RAPID DRUG SCREEN (HOSP PERFORMED)  ACETAMINOPHEN LEVEL  MAGNESIUM    Imaging Review No results found.   EKG Interpretation None      MDM   Final diagnoses:  Accidental drug overdose, initial encounter  Arthritis  Hypokalemia   I personally performed the services described in this documentation, which was scribed in my presence. The recorded information has been reviewed and is accurate.  Patient presents after taking too many over-the-counter pain medicines for her chronic arthritis. Patient is visiting and will followup outpatient with  primary care doctor upon return. Patient is not suicidal, patient is frustrated with her worsening knee pain bilateral. Patient has a safe place to stay.  Patient's potassium returned low, IV and oral potassium ordered. EKG check intervals. Plan for Tylenol, salicylate, basic blood work and observation. Patient's care be signed out to followup 4 hour Tylenol level, ekg, monitor while K being given.  Depending on Repeat K, EKG results discussed with pt and ED provider she may be admitted.  Filed Vitals:   10/14/14 2202  BP: 154/86  Pulse: 74  Temp: 98.5 F (36.9 C)  Resp: 16       Yesenia Skeens, MD 10/14/14 2308  Yesenia Skeens, MD 10/15/14 [phone]

## 2014-10-15 LAB — ACETAMINOPHEN LEVEL: Acetaminophen (Tylenol), Serum: 26.6 ug/mL (ref 10–30)

## 2014-10-15 LAB — RAPID URINE DRUG SCREEN, HOSP PERFORMED
Amphetamines: NOT DETECTED
BARBITURATES: NOT DETECTED
Benzodiazepines: NOT DETECTED
Cocaine: NOT DETECTED
OPIATES: POSITIVE — AB
Tetrahydrocannabinol: POSITIVE — AB

## 2014-10-15 LAB — LITHIUM LEVEL: Lithium Lvl: <0.25 mEq/L — ABNORMAL LOW (ref 0.80–1.40)

## 2014-10-15 LAB — MAGNESIUM: MAGNESIUM: 1.9 mg/dL (ref 1.5–2.5)

## 2014-10-15 MED ORDER — SODIUM CHLORIDE 0.9 % IV BOLUS (SEPSIS)
500.0000 mL | Freq: Once | INTRAVENOUS | Status: AC
  Administered 2014-10-15: 500 mL via INTRAVENOUS

## 2014-10-15 MED ORDER — POTASSIUM CHLORIDE ER 10 MEQ PO TBCR: 10.0000 meq | EXTENDED_RELEASE_TABLET | Freq: Every day | ORAL | Status: AC

## 2014-10-15 MED ORDER — HYDROCODONE-ACETAMINOPHEN 5-325 MG PO TABS: 1.0000 | ORAL_TABLET | Freq: Four times a day (QID) | ORAL | Status: DC | PRN

## 2014-10-15 NOTE — ED Notes (Signed)
MD notified of poison controls recommendations to repeat salicylate level.

## 2014-10-15 NOTE — ED Notes (Signed)
MD at bedside. 

## 2014-10-15 NOTE — Discharge Instructions (Signed)
If you were given medicines take as directed.  If you are on coumadin or contraceptives realize their levels and effectiveness is altered by many different medicines.  If you have any reaction (rash, tongues swelling, other) to the medicines stop taking and see a physician.   Please follow up as directed and return to the ER or see a physician for new or worsening symptoms.  Thank you. Filed Vitals:   10/14/14 2202  BP: 154/86  Pulse: 74  Temp: 98.5 F (36.9 C)  TempSrc: Oral  Resp: 16  Height: 5\' 4"  (1.626 m)  Weight: 176 lb (79.833 kg)  SpO2: 100%   Follow up with your md this week for recheck of potassium

## 2014-10-15 NOTE — ED Notes (Signed)
Repeat salicylate level in 2-3 hours after first one was drawn per poison control.

## 2014-10-15 NOTE — ED Notes (Signed)
Patient is resting comfortably. 

## 2014-10-15 NOTE — ED Notes (Signed)
Tylenol level does not have to be repeated if the MD feels confident on the times of ingestion.

## 2014-10-23 ENCOUNTER — Encounter (HOSPITAL_COMMUNITY): Payer: Self-pay | Admitting: Emergency Medicine

## 2014-10-23 ENCOUNTER — Emergency Department (HOSPITAL_COMMUNITY)
Admission: EM | Admit: 2014-10-23 | Discharge: 2014-10-23 | Disposition: A | Discharge status: Home / Self Care | Payer: Medicaid - Out of State | Attending: Emergency Medicine | Admitting: Emergency Medicine

## 2014-10-23 DIAGNOSIS — Z139 Encounter for screening, unspecified: Secondary | ICD-10-CM

## 2014-10-23 DIAGNOSIS — F319 Bipolar disorder, unspecified: Secondary | ICD-10-CM | POA: Diagnosis not present

## 2014-10-23 DIAGNOSIS — Z79899 Other long term (current) drug therapy: Secondary | ICD-10-CM | POA: Insufficient documentation

## 2014-10-23 DIAGNOSIS — E039 Hypothyroidism, unspecified: Secondary | ICD-10-CM | POA: Diagnosis not present

## 2014-10-23 DIAGNOSIS — Z0001 Encounter for general adult medical examination with abnormal findings: Secondary | ICD-10-CM | POA: Insufficient documentation

## 2014-10-23 DIAGNOSIS — E785 Hyperlipidemia, unspecified: Secondary | ICD-10-CM | POA: Insufficient documentation

## 2014-10-23 DIAGNOSIS — Z720 Tobacco use: Secondary | ICD-10-CM | POA: Diagnosis not present

## 2014-10-23 DIAGNOSIS — G5600 Carpal tunnel syndrome, unspecified upper limb: Secondary | ICD-10-CM | POA: Diagnosis not present

## 2014-10-23 DIAGNOSIS — R0602 Shortness of breath: Secondary | ICD-10-CM | POA: Insufficient documentation

## 2014-10-23 DIAGNOSIS — F606 Avoidant personality disorder: Secondary | ICD-10-CM | POA: Insufficient documentation

## 2014-10-23 DIAGNOSIS — M2550 Pain in unspecified joint: Secondary | ICD-10-CM | POA: Diagnosis not present

## 2014-10-23 DIAGNOSIS — I10 Essential (primary) hypertension: Secondary | ICD-10-CM | POA: Insufficient documentation

## 2014-10-23 DIAGNOSIS — J449 Chronic obstructive pulmonary disease, unspecified: Secondary | ICD-10-CM | POA: Diagnosis not present

## 2014-10-23 DIAGNOSIS — R05 Cough: Secondary | ICD-10-CM | POA: Diagnosis not present

## 2014-10-23 DIAGNOSIS — Z760 Encounter for issue of repeat prescription: Secondary | ICD-10-CM | POA: Diagnosis present

## 2014-10-23 MED ORDER — LEVOTHYROXINE SODIUM 50 MCG PO TABS: ORAL_TABLET | ORAL | Status: AC

## 2014-10-23 MED ORDER — LEVOTHYROXINE SODIUM 50 MCG PO TABS: 50.0000 ug | ORAL_TABLET | Freq: Every day | ORAL | Status: DC

## 2014-10-23 NOTE — ED Notes (Signed)
Pt visiting from out of town and car broke down and now pt needs medication refill (Zetia 10mg  tabs, Synthroid 50mcg, Zyrtec 10mg , Prevachol 40mg , and Protonix 40mg , Xanax 2mg ) until her care can be fixed and she can get back home to her regular MD.

## 2014-10-23 NOTE — Discharge Instructions (Signed)
Your vital signs are well within normal limits. Please see your doctor for reevaluation of your medications. Please return to the emergency department if any emergent changes, problems, or concerns.

## 2014-10-23 NOTE — ED Provider Notes (Signed)
CSN: [id number]     Arrival date & time 10/23/14  1325 History   First MD Initiated Contact with Patient 10/23/14 1442     Chief Complaint  Patient presents with  . Medication Refill     (Consider location/radiation/quality/duration/timing/severity/associated sxs/prior Treatment) HPI Comments: Patient is a 53 year old female from [place] who presents to the emergency department with a request for medication refill. The patient states that she is from out of town, her car broke down, and she now needs medication refills. She thinks that she will have her car fixed and about a week and is requesting a week supply of medications, including his area, Synthroid, Zyrtec, protocol, Protonix, Xanax, and hydrocodone. No new medical problems reported. No recent injury or trauma reported.  The history is provided by the patient.    Past Medical History  Diagnosis Date  . COPD (chronic obstructive pulmonary disease)   . Bipolar 1 disorder   . Osteoporosis   . Hypothyroid   . Hyperlipemia   . Hypertension   . Arthritis   . Anginal pain   . Carpal tunnel syndrome    Past Surgical History  Procedure Laterality Date  . Recovering addict    . Cardiac catheterization    . Abdominal hysterectomy     History reviewed. No pertinent family history. History  Substance Use Topics  . Smoking status: Current Some Day Smoker  . Smokeless tobacco: Not on file  . Alcohol Use: No   OB History   Grav Para Term Preterm Abortions TAB SAB Ect Mult Living                 Review of Systems  Constitutional: Negative for activity change.       All ROS Neg except as noted in HPI  Eyes: Negative for photophobia and discharge.  Respiratory: Positive for cough and shortness of breath. Negative for wheezing.   Cardiovascular: Negative for chest pain and palpitations.  Gastrointestinal: Negative for abdominal pain and blood in stool.  Genitourinary: Negative for dysuria, frequency and  hematuria.  Musculoskeletal: Positive for arthralgias. Negative for back pain and neck pain.  Skin: Negative.   Neurological: Negative for dizziness, seizures and speech difficulty.  Psychiatric/Behavioral: Negative for suicidal ideas, hallucinations, confusion and self-injury. The patient is nervous/anxious.       Allergies  Review of patient's allergies indicates no known allergies.  Home Medications   Prior to Admission medications   Medication Sig Start Date End Date Taking? Authorizing Provider  albuterol (PROAIR HFA) 108 (90 BASE) MCG/ACT inhaler Inhale 2 puffs into the lungs every 6 (six) hours as needed for wheezing or shortness of breath.    Yes Historical Provider, MD  alendronate (FOSAMAX) 70 MG tablet Take 70 mg by mouth every 7 (seven) days. Take with a full glass of water on an empty stomach OSTEOPOROSIS   Yes Historical Provider, MD  alprazolam Prudy Feeler(XANAX) 2 MG tablet Take 1 tablet (2 mg total) by mouth 4 (four) times daily as needed. For ANXIETY 11/18/12  Yes Dione Boozeavid Glick, MD  cetirizine (ZYRTEC) 10 MG tablet Take 10 mg by mouth daily. FOR ALLERGIES   Yes Historical Provider, MD  cyclobenzaprine (FLEXERIL) 5 MG tablet Take 5 mg by mouth 3 (three) times daily as needed. For MUSCLE SPASMS   Yes Historical Provider, MD  escitalopram (LEXAPRO) 20 MG tablet Take 20 mg by mouth daily.   Yes Historical Provider, MD  Fluticasone-Salmeterol (ADVAIR) 500-50 MCG/DOSE AEPB Inhale 1 puff into the  lungs every 12 (twelve) hours.   Yes Historical Provider, MD  gabapentin (NEURONTIN) 100 MG capsule Take 1 capsule (100 mg total) by mouth 3 (three) times daily. For NEUROPATHY 11/18/12  Yes Dione Boozeavid Glick, MD  HYDROcodone-acetaminophen [facility](NORCO) 10-325 MG per tablet Take 2 tablets by mouth every 4 (four) hours as needed for moderate pain or severe pain.   Yes Historical Provider, MD  imipramine (TOFRANIL) 50 MG tablet Take 50-150 mg by mouth at bedtime as needed. DEPRESSION   Yes Historical Provider, MD   levothyroxine (SYNTHROID, LEVOTHROID) 50 MCG tablet Take 1 tablet (50 mcg total) by mouth daily. For THYROID 11/18/12  Yes Dione Boozeavid Glick, MD  lisinopril-hydrochlorothiazide (PRINZIDE,ZESTORETIC) 10-12.5 MG per tablet Take 1 tablet by mouth daily.   Yes Historical Provider, MD  lithium carbonate (ESKALITH) 450 MG CR tablet Take 450 mg by mouth at bedtime.    Yes Historical Provider, MD  pantoprazole (PROTONIX) 40 MG tablet Take 40 mg by mouth daily.   Yes Historical Provider, MD  potassium chloride (K-DUR) 10 MEQ tablet Take 1 tablet (10 mEq total) by mouth daily. 10/15/14  Yes Benny LennertJoseph L Zammit, MD  pravastatin (PRAVACHOL) 40 MG tablet Take 40 mg by mouth daily. For CHOLESTEROL   Yes Historical Provider, MD  Vitamin D, Ergocalciferol, (DRISDOL) 50000 UNITS CAPS Take 50,000 Units by mouth every 7 (seven) days. SUPPLEMENT(Thursday)   Yes Historical Provider, MD  zolpidem (AMBIEN) 10 MG tablet Take 1 tablet (10 mg total) by mouth at bedtime as needed. For SLEEP 11/18/12  Yes Dione Boozeavid Glick, MD  levothyroxine (SYNTHROID, LEVOTHROID) 50 MCG tablet 1 po daily 10/23/14   Kathie DikeHobson M Naysa Puskas, PA-C   BP 137/75  Pulse 70  Temp(Src) 98.2 F (36.8 C) (Oral)  Resp 20  Ht 5\' 4"  (1.626 m)  Wt 160 lb (72.576 kg)  BMI 27.45 kg/m2  SpO2 100% Physical Exam  Nursing note and vitals reviewed. Constitutional: She is oriented to person, place, and time. She appears well-developed and well-nourished.  Non-toxic appearance.  HENT:  Head: Normocephalic.  Right Ear: Tympanic membrane and external ear normal.  Left Ear: Tympanic membrane and external ear normal.  Eyes: EOM and lids are normal. Pupils are equal, round, and reactive to light.  Neck: Normal range of motion. Neck supple. Carotid bruit is not present.  Cardiovascular: Normal rate, regular rhythm, normal heart sounds, intact distal pulses and normal pulses.   Pulmonary/Chest: Breath sounds normal. No respiratory distress.  Abdominal: Soft. Bowel sounds are  normal. There is no tenderness. There is no guarding.  Musculoskeletal: Normal range of motion.  Lymphadenopathy:       Head (right side): No submandibular adenopathy present.       Head (left side): No submandibular adenopathy present.    She has no cervical adenopathy.  Neurological: She is alert and oriented to person, place, and time. She has normal strength. No cranial nerve deficit or sensory deficit.  Skin: Skin is warm and dry.  Psychiatric: She has a normal mood and affect. Her speech is normal.    ED Course  Procedures (including critical care time) Labs Review Labs Reviewed - No data to display  Imaging Review No results found.   EKG Interpretation None      MDM  Vital signs were within normal limits. Patient initially requested refill of multiple medications, because her car has in need of repair. The patient states that she would need a week of medication until she can get back to her doctor in [place].  When I spoke with the patient about speaking with her doctor for confirmation of medications and for continuity of care the patient states that she is under a quotation marks contract" and she does not want to mess up her contract in [place]. She then declined having prescriptions refilled with the exception of her levothyroxine. Prescription for levothyroxine 50 mg given to the patient. She is given instructions to see her primary physician as soon as possible, but to return if any changes, problems, or concerns.    Final diagnoses:  Encounter for medical screening examination    *I have reviewed nursing notes, vital signs, and all appropriate lab and imaging results for this patient.Kathie Dike, PA-C 10/23/14 [phone]

## 2014-10-24 NOTE — ED Provider Notes (Signed)
Medical screening examination/treatment/procedure(s) were performed by non-physician practitioner and as supervising physician I was immediately available for consultation/collaboration.   EKG Interpretation None        Benny LennertJoseph L Jaki Steptoe, MD 10/24/14 1702
